# Patient Record
Sex: Female | Born: 1995 | Race: Black or African American | Hispanic: No | Marital: Single | Smoking: Never smoker
Health system: Southern US, Community
[De-identification: ages and names within clinical notes are randomized; demographics above are authoritative.]

## PROBLEM LIST (undated history)

## (undated) DIAGNOSIS — J45909 Unspecified asthma, uncomplicated: Secondary | ICD-10-CM

---
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
## 2016-04-10 ENCOUNTER — Telehealth: Payer: Self-pay

## 2016-04-10 NOTE — Telephone Encounter (Signed)
Patient called and wanted to know if Dr. Okey Duprerawford is taking on new patient.   [phone] call back number for patient.

## 2016-04-10 NOTE — Telephone Encounter (Signed)
Spoke to patient and informed her that we are taking new patients.

## 2016-05-07 ENCOUNTER — Emergency Department (HOSPITAL_COMMUNITY)
Admission: EM | Admit: 2016-05-07 | Discharge: 2016-05-08 | Discharge status: Against Medical Advice | Payer: Self-pay | Attending: Dermatology | Admitting: Dermatology

## 2016-05-07 ENCOUNTER — Encounter (HOSPITAL_COMMUNITY): Payer: Self-pay | Admitting: Emergency Medicine

## 2016-05-07 DIAGNOSIS — R110 Nausea: Secondary | ICD-10-CM | POA: Insufficient documentation

## 2016-05-07 DIAGNOSIS — S0990XA Unspecified injury of head, initial encounter: Secondary | ICD-10-CM | POA: Insufficient documentation

## 2016-05-07 DIAGNOSIS — J45909 Unspecified asthma, uncomplicated: Secondary | ICD-10-CM | POA: Insufficient documentation

## 2016-05-07 DIAGNOSIS — W319XXA Contact with unspecified machinery, initial encounter: Secondary | ICD-10-CM | POA: Insufficient documentation

## 2016-05-07 DIAGNOSIS — Y939 Activity, unspecified: Secondary | ICD-10-CM | POA: Insufficient documentation

## 2016-05-07 DIAGNOSIS — Z5321 Procedure and treatment not carried out due to patient leaving prior to being seen by health care provider: Secondary | ICD-10-CM | POA: Insufficient documentation

## 2016-05-07 DIAGNOSIS — Y990 Civilian activity done for income or pay: Secondary | ICD-10-CM | POA: Insufficient documentation

## 2016-05-07 DIAGNOSIS — Y929 Unspecified place or not applicable: Secondary | ICD-10-CM | POA: Insufficient documentation

## 2016-05-07 HISTORY — DX: Unspecified asthma, uncomplicated: J45.909
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
## 2016-12-15 ENCOUNTER — Encounter (HOSPITAL_COMMUNITY): Payer: Self-pay | Admitting: Emergency Medicine

## 2016-12-15 ENCOUNTER — Emergency Department (HOSPITAL_COMMUNITY): Payer: Worker's Compensation

## 2016-12-15 ENCOUNTER — Emergency Department (HOSPITAL_COMMUNITY)
Admission: EM | Admit: 2016-12-15 | Discharge: 2016-12-15 | Disposition: A | Discharge status: Home / Self Care | Payer: Worker's Compensation | Attending: Emergency Medicine | Admitting: Emergency Medicine

## 2016-12-15 DIAGNOSIS — Y939 Activity, unspecified: Secondary | ICD-10-CM | POA: Insufficient documentation

## 2016-12-15 DIAGNOSIS — Y92019 Unspecified place in single-family (private) house as the place of occurrence of the external cause: Secondary | ICD-10-CM | POA: Diagnosis not present

## 2016-12-15 DIAGNOSIS — Y990 Civilian activity done for income or pay: Secondary | ICD-10-CM | POA: Insufficient documentation

## 2016-12-15 DIAGNOSIS — S0990XA Unspecified injury of head, initial encounter: Secondary | ICD-10-CM | POA: Insufficient documentation

## 2016-12-15 DIAGNOSIS — S60222A Contusion of left hand, initial encounter: Secondary | ICD-10-CM | POA: Insufficient documentation

## 2016-12-15 DIAGNOSIS — S6992XA Unspecified injury of left wrist, hand and finger(s), initial encounter: Secondary | ICD-10-CM | POA: Diagnosis present

## 2016-12-15 DIAGNOSIS — J45909 Unspecified asthma, uncomplicated: Secondary | ICD-10-CM | POA: Insufficient documentation

## 2016-12-15 LAB — POC URINE PREG, ED: Preg Test, Ur: NEGATIVE
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
## 2016-12-15 NOTE — Discharge Instructions (Signed)
You may alternate between Tylenol 1000 mg every 6 hours as needed for pain and ibuprofen 800 mg every 8 hours as needed for pain. ° ° ° °Please avoid alcohol for the next week.  Please rest and drink plenty of water.  We recommend that you avoid any activity that may lead to another head injury for at least 1 week or until your symptoms have completely resolved.  We also recommend "brain rest" - please avoid TV, cell phones, tablets, computers as much as possible for the next 48 hours.   ° ° ° °To find a primary care or specialty doctor please call [phone] or [phone] to access "[facility] Find a Doctor Service." ° °You may also go on the [facility] website at [email or web address] ° °There are also multiple [facility], [facility], [facility] and [facility] practices throughout the [place] that are frequently accepting new patients. You may find a clinic that is close to your home and contact them. ° °[facility] -  °[address] °[place] [address] °[phone] ° ° °[facility] -  °[address] °[place] [address] [phone] ° ° °[facility] - °[address]  °[place] [address] °[phone] ° ° °

## 2018-06-27 ENCOUNTER — Encounter (HOSPITAL_BASED_OUTPATIENT_CLINIC_OR_DEPARTMENT_OTHER): Payer: Self-pay | Admitting: *Deleted

## 2018-06-27 ENCOUNTER — Emergency Department (HOSPITAL_BASED_OUTPATIENT_CLINIC_OR_DEPARTMENT_OTHER): Payer: Self-pay

## 2018-06-27 ENCOUNTER — Emergency Department (HOSPITAL_BASED_OUTPATIENT_CLINIC_OR_DEPARTMENT_OTHER)
Admission: EM | Admit: 2018-06-27 | Discharge: 2018-06-28 | Disposition: A | Discharge status: Home / Self Care | Payer: Self-pay | Attending: Emergency Medicine | Admitting: Emergency Medicine

## 2018-06-27 ENCOUNTER — Other Ambulatory Visit: Payer: Self-pay

## 2018-06-27 DIAGNOSIS — R05 Cough: Secondary | ICD-10-CM | POA: Insufficient documentation

## 2018-06-27 DIAGNOSIS — M940 Chondrocostal junction syndrome [Tietze]: Secondary | ICD-10-CM | POA: Insufficient documentation

## 2018-06-27 DIAGNOSIS — R059 Cough, unspecified: Secondary | ICD-10-CM

## 2018-06-27 NOTE — ED Triage Notes (Signed)
Cough x 2 months. She feels like she has a cold. Pain in her chest.

## 2018-06-28 MED ORDER — NAPROXEN 250 MG PO TABS
500.0000 mg | ORAL_TABLET | Freq: Once | ORAL | Status: AC
  Administered 2018-06-28: 500 mg via ORAL
  Filled 2018-06-28: qty 2

## 2018-06-28 MED ORDER — ALBUTEROL SULFATE HFA 108 (90 BASE) MCG/ACT IN AERS
2.0000 | INHALATION_SPRAY | RESPIRATORY_TRACT | Status: DC | PRN
Start: 2018-06-28 — End: 2018-06-28
  Administered 2018-06-28: 2 via RESPIRATORY_TRACT
  Filled 2018-06-28 (×2): qty 6.7

## 2018-06-28 MED ORDER — NAPROXEN 375 MG PO TABS: ORAL_TABLET | ORAL | 0 refills | Status: DC

## 2018-06-28 NOTE — ED Provider Notes (Signed)
[facility] Provider Note: Martha Dell, MD, FACEP  CSN: [id number] MRN: [id number] ARRIVAL: 06/27/18 at 2214 ROOM: MH02/MH02   CHIEF COMPLAINT  Cough   HISTORY OF PRESENT ILLNESS  06/28/18 12:09 AM Martha Simmons is a 22 y.o. female with a 45-month history of cough.  The cough is primarily nonproductive but occasionally productive of sputum.  She has had no associated fever or chills.  She has an inhaler for asthma but has not used it in a long time and believes it is expired.  She has come in now because her chest is hurting.  The pain is located in the sternum and is worse with cough or movement.  It is moderate in severity.  She denies nausea, vomiting or diarrhea.   Past Medical History:  Diagnosis Date  . Asthma     History reviewed. No pertinent surgical history.  No family history on file.  Social History   Tobacco Use  . Smoking status: Never Smoker  . Smokeless tobacco: Never Used  Substance Use Topics  . Alcohol use: No  . Drug use: No    Prior to Admission medications   Not on File    Allergies Patient has no known allergies.   REVIEW OF SYSTEMS  Negative except as noted here or in the History of Present Illness.   PHYSICAL EXAMINATION  Initial Vital Signs Blood pressure 122/78, pulse 96, temperature 98.1 F (36.7 C), temperature source Oral, resp. rate 18, height 5\' 6"  (1.676 m), weight 56.7 kg (125 lb), last menstrual period 06/06/2018, SpO2 100 %.  Examination General: Well-developed, well-nourished female in no acute distress; appearance consistent with age of record HENT: normocephalic; atraumatic Eyes: pupils equal, round and reactive to light; extraocular muscles intact Neck: supple Heart: regular rate and rhythm Lungs: Mildly decreased air movement bilaterally Chest: Sternal tenderness Abdomen: soft; nondistended; nontender; bowel sounds present Extremities: No deformity; full range of motion; pulses normal Neurologic:  Awake, alert and oriented; motor function intact in all extremities and symmetric; no facial droop Skin: Warm and dry Psychiatric: Normal mood and affect   RESULTS  Summary of this visit's results, reviewed by myself:   EKG Interpretation  Date/Time:    Ventricular Rate:    PR Interval:    QRS Duration:   QT Interval:    QTC Calculation:   R Axis:     Text Interpretation:        Laboratory Studies: No results found for this or any previous visit (from the past 24 hour(s)). Imaging Studies: Dg Chest 2 View  Result Date: 06/27/2018 CLINICAL DATA:  Cough EXAM: CHEST - 2 VIEW COMPARISON:  Report 02/19/2012 FINDINGS: The heart size and mediastinal contours are within normal limits. Both lungs are clear. The visualized skeletal structures are unremarkable. IMPRESSION: No active cardiopulmonary disease. Electronically Signed   By: Jasmine Pang M.D.   On: 06/27/2018 23:18    ED COURSE and MDM  Nursing notes and initial vitals signs, including pulse oximetry, reviewed.  Vitals:   06/27/18 2218 06/27/18 2219 06/27/18 2301  BP:  122/78   Pulse:  85 96  Resp:  16 18  Temp:  98.4 F (36.9 C) 98.1 F (36.7 C)  TempSrc:  Oral Oral  SpO2:  100% 100%  Weight: 56.7 kg (125 lb)    Height: 5\' 6"  (1.676 m)     We will refill patient's inhaler as her cough is likely asthma related.  We will start her on an NSAID for  her chest pain which is likely costochondritis.  PROCEDURES    ED DIAGNOSES     ICD-10-CM   1. Cough R05   2. Acute costochondritis M94.0        Ronnette Rump, Jonny RuizJohn, MD 06/28/18 [phone]

## 2019-09-15 ENCOUNTER — Emergency Department (HOSPITAL_COMMUNITY): Payer: Self-pay

## 2019-09-15 ENCOUNTER — Emergency Department (HOSPITAL_COMMUNITY)
Admission: EM | Admit: 2019-09-15 | Discharge: 2019-09-15 | Disposition: A | Discharge status: Home / Self Care | Payer: Self-pay | Attending: Emergency Medicine | Admitting: Emergency Medicine

## 2019-09-15 ENCOUNTER — Other Ambulatory Visit: Payer: Self-pay

## 2019-09-15 ENCOUNTER — Encounter (HOSPITAL_COMMUNITY): Payer: Self-pay

## 2019-09-15 DIAGNOSIS — J45909 Unspecified asthma, uncomplicated: Secondary | ICD-10-CM | POA: Insufficient documentation

## 2019-09-15 DIAGNOSIS — R1010 Upper abdominal pain, unspecified: Secondary | ICD-10-CM | POA: Insufficient documentation

## 2019-09-15 DIAGNOSIS — Z79899 Other long term (current) drug therapy: Secondary | ICD-10-CM | POA: Insufficient documentation

## 2019-09-15 LAB — COMPREHENSIVE METABOLIC PANEL
ALT: 12 U/L (ref 0–44)
AST: 20 U/L (ref 15–41)
Albumin: 3.6 g/dL (ref 3.5–5.0)
Alkaline Phosphatase: 45 U/L (ref 38–126)
Anion gap: 10 (ref 5–15)
BUN: 14 mg/dL (ref 6–20)
CO2: 24 mmol/L (ref 22–32)
Calcium: 8.9 mg/dL (ref 8.9–10.3)
Chloride: 106 mmol/L (ref 98–111)
Creatinine, Ser: 0.87 mg/dL (ref 0.44–1.00)
GFR calc Af Amer: >60 mL/min (ref >60)
GFR calc non Af Amer: >60 mL/min (ref >60)
Glucose, Bld: 99 mg/dL (ref 70–99)
Potassium: 3.9 mmol/L (ref 3.5–5.1)
Sodium: 140 mmol/L (ref 135–145)
Total Bilirubin: 0.7 mg/dL (ref 0.3–1.2)
Total Protein: 7.2 g/dL (ref 6.5–8.1)

## 2019-09-15 LAB — CBC
HCT: 37.5 % (ref 36.0–46.0)
Hemoglobin: 11.4 g/dL — ABNORMAL LOW (ref 12.0–15.0)
MCH: 27.5 pg (ref 26.0–34.0)
MCHC: 30.4 g/dL (ref 30.0–36.0)
MCV: 90.6 fL (ref 80.0–100.0)
Platelets: 269 10*3/uL (ref 150–400)
RBC: 4.14 MIL/uL (ref 3.87–5.11)
RDW: 11.9 % (ref 11.5–15.5)
WBC: 7.7 10*3/uL (ref 4.0–10.5)
nRBC: 0 % (ref 0.0–0.2)

## 2019-09-15 LAB — I-STAT BETA HCG BLOOD, ED (MC, WL, AP ONLY): I-stat hCG, quantitative: <5 m[IU]/mL (ref <5)

## 2019-09-15 LAB — LIPASE, BLOOD: Lipase: 44 U/L (ref 11–51)

## 2019-09-15 MED ORDER — IOHEXOL 300 MG/ML  SOLN
75.0000 mL | Freq: Once | INTRAMUSCULAR | Status: AC | PRN
  Administered 2019-09-15: 75 mL via INTRAVENOUS

## 2019-09-15 MED ORDER — SODIUM CHLORIDE (PF) 0.9 % IJ SOLN
INTRAMUSCULAR | Status: AC
  Filled 2019-09-15: qty 50

## 2019-09-15 MED ORDER — HYDROMORPHONE HCL 1 MG/ML IJ SOLN
0.5000 mg | Freq: Once | INTRAMUSCULAR | Status: AC
  Administered 2019-09-15: 0.5 mg via INTRAVENOUS
  Filled 2019-09-15: qty 1

## 2019-09-15 MED ORDER — ONDANSETRON HCL 4 MG/2ML IJ SOLN
4.0000 mg | Freq: Once | INTRAMUSCULAR | Status: AC
  Administered 2019-09-15: 4 mg via INTRAVENOUS
  Filled 2019-09-15: qty 2

## 2019-09-15 MED ORDER — SODIUM CHLORIDE 0.9% FLUSH
3.0000 mL | Freq: Once | INTRAVENOUS | Status: AC
  Administered 2019-09-15: 3 mL via INTRAVENOUS

## 2019-09-15 NOTE — Discharge Instructions (Addendum)
Take liquid motrin for pain.   You can get that at the pharmacy without a prescription.  Follow-up with the women's clinic either this week or next week

## 2019-09-15 NOTE — ED Provider Notes (Signed)
[facility] DEPT Provider Note   CSN: [id number] Arrival date & time: 09/15/19  1443     History   Chief Complaint Chief Complaint  Patient presents with   Abdominal Pain    HPI Martha Simmons is a 23 y.o. female.     Patient complains of right lower quadrant pain.  Patient has no fever no chills  The history is provided by the patient. No language interpreter was used.  Abdominal Pain Pain location:  RLQ Pain quality: aching   Pain radiates to:  Does not radiate Pain severity:  Moderate Onset quality:  Sudden Timing:  Constant Progression:  Worsening Chronicity:  New Context: not alcohol use   Relieved by:  Nothing Worsened by:  Nothing Associated symptoms: no chest pain, no cough, no diarrhea, no fatigue and no hematuria     Past Medical History:  Diagnosis Date   Asthma     There are no active problems to display for this patient.   History reviewed. No pertinent surgical history.   OB History   No obstetric history on file.      Home Medications    Prior to Admission medications   Medication Sig Start Date End Date Taking? Authorizing Provider  albuterol (VENTOLIN HFA) 108 (90 Base) MCG/ACT inhaler Inhale 2 puffs into the lungs every 4 (four) hours as needed for shortness of breath. 02/16/13  Yes [provider]  naproxen (NAPROSYN) 375 MG tablet Take 1 tablet twice daily as needed for chest wall pain. Patient not taking: Reported on 09/15/2019 06/28/18   Molpus, Jenny Reichmann, MD    Family History History reviewed. No pertinent family history.  Social History Social History   Tobacco Use   Smoking status: Never Smoker   Smokeless tobacco: Never Used  Substance Use Topics   Alcohol use: No   Drug use: No     Allergies   Patient has no known allergies.   Review of Systems Review of Systems  Constitutional: Negative for appetite change and fatigue.  HENT: Negative for congestion, ear discharge  and sinus pressure.   Eyes: Negative for discharge.  Respiratory: Negative for cough.   Cardiovascular: Negative for chest pain.  Gastrointestinal: Positive for abdominal pain. Negative for diarrhea.  Genitourinary: Negative for frequency and hematuria.  Musculoskeletal: Negative for back pain.  Skin: Negative for rash.  Neurological: Negative for seizures and headaches.  Psychiatric/Behavioral: Negative for hallucinations.     Physical Exam Updated Vital Signs Pulse 87    Ht 5\' 6"  (1.676 m)    Wt 56.7 kg    LMP 09/14/2019 (Approximate)    SpO2 100%    BMI 20.18 kg/m   Physical Exam Vitals signs and nursing note reviewed.  Constitutional:      Appearance: She is well-developed.  HENT:     Head: Normocephalic.     Mouth/Throat:     Mouth: Mucous membranes are moist.  Eyes:     General: No scleral icterus.    Conjunctiva/sclera: Conjunctivae normal.  Neck:     Musculoskeletal: Neck supple.     Thyroid: No thyromegaly.  Cardiovascular:     Rate and Rhythm: Normal rate and regular rhythm.     Heart sounds: No murmur. No friction rub. No gallop.   Pulmonary:     Breath sounds: No stridor. No wheezing or rales.  Chest:     Chest wall: No tenderness.  Abdominal:     General: There is no distension.  Tenderness: There is abdominal tenderness. There is no rebound.  Musculoskeletal: Normal range of motion.  Lymphadenopathy:     Cervical: No cervical adenopathy.  Skin:    Findings: No erythema or rash.  Neurological:     Mental Status: She is alert and oriented to person, place, and time.     Motor: No abnormal muscle tone.     Coordination: Coordination normal.  Psychiatric:        Behavior: Behavior normal.      ED Treatments / Results  Labs (all labs ordered are listed, but only abnormal results are displayed) Labs Reviewed  CBC - Abnormal; Notable for the following components:      Result Value   Hemoglobin 11.4 (*)    All other components within normal  limits  LIPASE, BLOOD  COMPREHENSIVE METABOLIC PANEL  URINALYSIS, ROUTINE W REFLEX MICROSCOPIC  I-STAT BETA HCG BLOOD, ED ([facility], [facility], [facility] ONLY)    EKG None  Radiology Ct Abdomen Pelvis W Contrast  Result Date: 09/15/2019 CLINICAL DATA:  23 year old female with abdominal distension and pain. One episode vomiting. EXAM: CT ABDOMEN AND PELVIS WITH CONTRAST TECHNIQUE: Multidetector CT imaging of the abdomen and pelvis was performed using the standard protocol following bolus administration of intravenous contrast. CONTRAST:  15mL OMNIPAQUE IOHEXOL 300 MG/ML  SOLN COMPARISON:  None. FINDINGS: Lower chest: The visualized lung bases are clear. No intra-abdominal free air. Small free fluid within the pelvis. Hepatobiliary: No focal liver abnormality is seen. No gallstones, gallbladder wall thickening, or biliary dilatation. Pancreas: Unremarkable. No pancreatic ductal dilatation or surrounding inflammatory changes. Spleen: Normal in size without focal abnormality. Adrenals/Urinary Tract: The adrenal glands are unremarkable. Polycystic renal morphology. The largest cyst measures up to 17 mm in the interpolar aspect the left kidney. Ultrasound or MRI may provide better evaluation of the cysts on a nonemergent basis. There is no hydronephrosis on either side. The visualized ureters and urinary bladder appear unremarkable. Stomach/Bowel: Apparent mild thickened appearance of the gastroesophageal junction may be related to underdistention. Focal esophagitis is not excluded. Clinical correlation is recommended. There is no bowel obstruction. The appendix is normal. Vascular/Lymphatic: The abdominal aorta and IVC are unremarkable. No venous gas. There is no adenopathy. Reproductive: The uterus and ovaries are grossly unremarkable. No mass. Other: None Musculoskeletal: No acute or significant osseous findings. IMPRESSION: 1. Apparent mild thickened appearance of the gastroesophageal junction may be related to  underdistention. Focal esophagitis is not excluded. Clinical correlation is recommended. No bowel obstruction. Normal appendix. 2. Polycystic renal morphology. Ultrasound or MRI may provide better evaluation of the cysts on a nonemergent basis. Electronically Signed   By: Elgie Collard M.D.   On: 09/15/2019 20:20    Procedures Procedures (including critical care time)  Medications Ordered in ED Medications  sodium chloride (PF) 0.9 % injection (0 mLs  Hold 09/15/19 1932)  sodium chloride flush (NS) 0.9 % injection 3 mL (3 mLs Intravenous Given 09/15/19 1819)  HYDROmorphone (DILAUDID) injection 0.5 mg (0.5 mg Intravenous Given 09/15/19 1818)  ondansetron (ZOFRAN) injection 4 mg (4 mg Intravenous Given 09/15/19 1820)  iohexol (OMNIPAQUE) 300 MG/ML solution 75 mL (75 mLs Intravenous Contrast Given 09/15/19 1920)     Initial Impression / Assessment and Plan / ED Course  I have reviewed the triage vital signs and the nursing notes.  Pertinent labs & imaging results that were available during my care of the patient were reviewed by me and considered in my medical decision making (see chart for details).  Labs unremarkable.  CT scan suggests cysts on her right ovary.  She will be given Motrin for pain and will follow-up with GYN clinic  Final Clinical Impressions(s) / ED Diagnoses   Final diagnoses:  Pain of upper abdomen    ED Discharge Orders    None       Bethann BerkshireZammit, Eeshan Verbrugge, MD 09/15/19 2115

## 2019-09-15 NOTE — ED Triage Notes (Signed)
Pt presents with c/o abdominal pain that started 45 minutes ago. Pt reports one episode of vomiting since the pain started. Pt is very tearful in triage.

## 2019-09-15 NOTE — ED Notes (Signed)
\  [id number]\

## 2020-03-25 ENCOUNTER — Other Ambulatory Visit: Payer: Self-pay

## 2020-03-25 ENCOUNTER — Emergency Department (HOSPITAL_COMMUNITY)
Admission: EM | Admit: 2020-03-25 | Discharge: 2020-03-25 | Disposition: A | Discharge status: Home / Self Care | Payer: Self-pay | Attending: Emergency Medicine | Admitting: Emergency Medicine

## 2020-03-25 DIAGNOSIS — J45909 Unspecified asthma, uncomplicated: Secondary | ICD-10-CM | POA: Insufficient documentation

## 2020-03-25 DIAGNOSIS — J020 Streptococcal pharyngitis: Secondary | ICD-10-CM | POA: Insufficient documentation

## 2020-03-25 LAB — GROUP A STREP BY PCR: Group A Strep by PCR: DETECTED — AB

## 2020-03-25 MED ORDER — AMOXICILLIN 400 MG/5ML PO SUSR: 1000.0000 mg | Freq: Every day | ORAL | 0 refills | Status: AC

## 2020-03-25 MED ORDER — DEXAMETHASONE 1 MG/ML PO CONC
10.0000 mg | Freq: Once | ORAL | Status: AC
Start: 2020-03-25 — End: 2020-03-25
  Administered 2020-03-25: 17:00:00 10 mg via ORAL
  Filled 2020-03-25: qty 10

## 2020-03-25 MED ORDER — ACETAMINOPHEN 160 MG/5ML PO SOLN
650.0000 mg | Freq: Once | ORAL | Status: AC
  Administered 2020-03-25: 650 mg via ORAL
  Filled 2020-03-25: qty 20.3

## 2020-03-25 NOTE — ED Triage Notes (Signed)
C/O difficulty swallowing and feels like throat is closing. VSS

## 2020-03-25 NOTE — ED Provider Notes (Signed)
[facility] EMERGENCY DEPARTMENT Provider Note   CSN: [id number] Arrival date & time: 03/25/20  1355     History Chief Complaint  Patient presents with  . Sore Throat    Martha Simmons is a 24 y.o. female.  HPI   24 year old female history of asthma presenting for evaluation of sore throat that started yesterday.  States the pain is constant and severe in nature.  It is worse when she tries to swallow.  She has been able to drink some water at home.  Denies any associated fevers.  Denies other symptoms.  Denies any contacts with similar symptoms.  Past Medical History:  Diagnosis Date  . Asthma     There are no problems to display for this patient.   No past surgical history on file.   OB History   No obstetric history on file.     No family history on file.  Social History   Tobacco Use  . Smoking status: Never Smoker  . Smokeless tobacco: Never Used  Substance Use Topics  . Alcohol use: No  . Drug use: No    Home Medications Prior to Admission medications   Medication Sig Start Date End Date Taking? Authorizing Provider  albuterol (VENTOLIN HFA) 108 (90 Base) MCG/ACT inhaler Inhale 2 puffs into the lungs every 4 (four) hours as needed for shortness of breath. 02/16/13   [provider]  amoxicillin (AMOXIL) 400 MG/5ML suspension Take 12.5 mLs (1,000 mg total) by mouth daily for 10 days. 03/25/20 04/04/20  Aldo Sondgeroth S, PA-C  naproxen (NAPROSYN) 375 MG tablet Take 1 tablet twice daily as needed for chest wall pain. Patient not taking: Reported on 09/15/2019 06/28/18   Molpus, Jenny Reichmann, MD    Allergies    Patient has no known allergies.  Review of Systems   Review of Systems  Constitutional: Negative for fever.  HENT: Positive for sore throat. Negative for congestion and rhinorrhea.   Respiratory: Negative for cough and shortness of breath.   Cardiovascular: Negative for chest pain.  Gastrointestinal: Negative for abdominal  pain.    Physical Exam Updated Vital Signs BP 108/65   Pulse 84   Temp 99.1 F (37.3 C) (Oral)   Resp 18   Ht 5\' 6"  (1.676 m)   Wt 56.7 kg   SpO2 99%   BMI 20.18 kg/m   Physical Exam Vitals and nursing note reviewed.  Constitutional:      General: She is not in acute distress.    Appearance: She is well-developed.  HENT:     Head: Normocephalic and atraumatic.     Mouth/Throat:     Mouth: Mucous membranes are moist.     Pharynx: Posterior oropharyngeal erythema present. No oropharyngeal exudate or uvula swelling.     Tonsils: No tonsillar exudate or tonsillar abscesses. 1+ on the right. 1+ on the left.  Eyes:     Conjunctiva/sclera: Conjunctivae normal.  Cardiovascular:     Rate and Rhythm: Normal rate.  Pulmonary:     Effort: Pulmonary effort is normal.  Musculoskeletal:        General: Normal range of motion.     Cervical back: Neck supple.  Lymphadenopathy:     Cervical: Cervical adenopathy present.  Skin:    General: Skin is warm and dry.  Neurological:     Mental Status: She is alert.     ED Results / Procedures / Treatments   Labs (all labs ordered are listed, but only abnormal results  are displayed) Labs Reviewed  GROUP A STREP BY PCR - Abnormal; Notable for the following components:      Result Value   Group A Strep by PCR DETECTED (*)    All other components within normal limits    EKG None  Radiology No results found.  Procedures Procedures (including critical care time)  Medications Ordered in ED Medications  dexamethasone (DECADRON) 1 MG/ML solution 10 mg (has no administration in time range)  acetaminophen (TYLENOL) 160 MG/5ML solution 650 mg (650 mg Oral Given 03/25/20 1413)    ED Course  I have reviewed the triage vital signs and the nursing notes.  Pertinent labs & imaging results that were available during my care of the patient were reviewed by me and considered in my medical decision making (see chart for details).    MDM  Rules/Calculators/A&P                      Pt febrile with tonsillar exudate, cervical lymphadenopathy, & dysphagia; diagnosis of strep. Treated in the Ed with steroids. Pt declines IM PCN. Will give oral abx. Presentation non concerning for PTA or infxn spread to soft tissue. No trismus or uvula deviation. Specific return precautions discussed. Pt able to drink water at home with intact air way. Recommended PCP follow up.    Final Clinical Impression(s) / ED Diagnoses Final diagnoses:  Strep throat    Rx / DC Orders ED Discharge Orders         Ordered    amoxicillin (AMOXIL) 400 MG/5ML suspension  Daily     03/25/20 1632           Karrie Meres, PA-C 03/25/20 1632    Tegeler, Canary Brim, MD 03/28/20 [phone]

## 2020-03-25 NOTE — ED Notes (Signed)
Patient verbalizes understanding of discharge instructions. Opportunity for questioning and answers were provided. Armband removed by staff, pt discharged from ED ambulatory.   

## 2020-03-25 NOTE — Discharge Instructions (Addendum)
You were given a prescription for antibiotics. Please take the antibiotic prescription fully.   Please follow up with your primary care provider within 5-7 days for re-evaluation of your symptoms. If you do not have a primary care provider, information for a healthcare clinic has been provided for you to make arrangements for follow up care. Please return to the emergency department for any new or worsening symptoms.  

## 2020-07-31 ENCOUNTER — Emergency Department (HOSPITAL_COMMUNITY)
Admission: EM | Admit: 2020-07-31 | Discharge: 2020-07-31 | Disposition: A | Discharge status: Home / Self Care | Payer: Self-pay | Attending: Emergency Medicine | Admitting: Emergency Medicine

## 2020-07-31 DIAGNOSIS — Z5321 Procedure and treatment not carried out due to patient leaving prior to being seen by health care provider: Secondary | ICD-10-CM | POA: Insufficient documentation

## 2020-07-31 DIAGNOSIS — J029 Acute pharyngitis, unspecified: Secondary | ICD-10-CM | POA: Insufficient documentation

## 2020-07-31 NOTE — ED Notes (Signed)
Pt called for triage, no response. 

## 2020-07-31 NOTE — ED Triage Notes (Signed)
Pt states that she woke up with sore throat.  Pt is not covid vaccinated

## 2020-07-31 NOTE — ED Notes (Signed)
Called pt for triage multiple times.  No answer.

## 2020-07-31 NOTE — ED Notes (Signed)
Pt did not respond/come forward when called for triage.

## 2020-08-01 ENCOUNTER — Ambulatory Visit
Admission: EM | Admit: 2020-08-01 | Discharge: 2020-08-01 | Disposition: A | Discharge status: Home / Self Care | Payer: Self-pay | Attending: Emergency Medicine | Admitting: Emergency Medicine

## 2020-08-01 ENCOUNTER — Other Ambulatory Visit: Payer: Self-pay

## 2020-08-01 DIAGNOSIS — J069 Acute upper respiratory infection, unspecified: Secondary | ICD-10-CM

## 2020-08-01 DIAGNOSIS — Z1152 Encounter for screening for COVID-19: Secondary | ICD-10-CM

## 2020-08-01 MED ORDER — PSEUDOEPH-BROMPHEN-DM 30-2-10 MG/5ML PO SYRP: 10.0000 mL | ORAL_SOLUTION | Freq: Four times a day (QID) | ORAL | 0 refills | Status: DC | PRN

## 2020-08-01 MED ORDER — IBUPROFEN 100 MG/5ML PO SUSP: 600.0000 mg | Freq: Four times a day (QID) | ORAL | 0 refills | Status: DC | PRN

## 2020-08-01 NOTE — ED Triage Notes (Signed)
Pt c/o cough and nasal congestion since yesterday. States took theriflu last night. Requesting covid testing. Denies having covid vaccines.

## 2020-08-01 NOTE — ED Provider Notes (Signed)
[name]    CSN: [id number] Arrival date & time: 08/01/20  7902      History   Chief Complaint Chief Complaint  Patient presents with   Cough    HPI Martha Simmons is a 24 y.o. female history of asthma presenting today for evaluation of cough sore throat and congestion.  Symptoms began yesterday.  Took TheraFlu last night without significant relief.  Reports being unvaccinated.  Denies GI symptoms.  Denies chest pain or shortness of breath.  HPI  Past Medical History:  Diagnosis Date   Asthma     There are no problems to display for this patient.   History reviewed. No pertinent surgical history.  OB History   No obstetric history on file.      Home Medications    Prior to Admission medications   Medication Sig Start Date End Date Taking? Authorizing Provider  albuterol (VENTOLIN HFA) 108 (90 Base) MCG/ACT inhaler Inhale 2 puffs into the lungs every 4 (four) hours as needed for shortness of breath. 02/16/13   [provider]  brompheniramine-pseudoephedrine-DM 30-2-10 MG/5ML syrup Take 10 mLs by mouth 4 (four) times daily as needed. 08/01/20   Ziare Cryder C, PA-C  ibuprofen (ADVIL) 100 MG/5ML suspension Take 30 mLs (600 mg total) by mouth every 6 (six) hours as needed. 08/01/20   Demetria Iwai, Junius Creamer, PA-C    Family History No family history on file.  Social History Social History   Tobacco Use   Smoking status: Never Smoker   Smokeless tobacco: Never Used  Substance Use Topics   Alcohol use: Yes   Drug use: No     Allergies   Patient has no known allergies.   Review of Systems Review of Systems  Constitutional: Negative for activity change, appetite change, chills, fatigue and fever.  HENT: Positive for congestion, rhinorrhea and sore throat. Negative for ear pain, sinus pressure and trouble swallowing.   Eyes: Negative for discharge and redness.  Respiratory: Positive for cough. Negative for chest tightness and  shortness of breath.   Cardiovascular: Negative for chest pain.  Gastrointestinal: Negative for abdominal pain, diarrhea, nausea and vomiting.  Musculoskeletal: Negative for myalgias.  Skin: Negative for rash.  Neurological: Negative for dizziness, light-headedness and headaches.     Physical Exam Triage Vital Signs ED Triage Vitals [08/01/20 1108]  Enc Vitals Group     BP 112/77     Pulse Rate 81     Resp 18     Temp 98.4 F (36.9 C)     Temp Source Oral     SpO2 97 %     Weight      Height      Head Circumference      Peak Flow      Pain Score 0     Pain Loc      Pain Edu?      Excl. in GC?    No data found.  Updated Vital Signs BP 112/77 (BP Location: Left Arm)    Pulse 81    Temp 98.4 F (36.9 C) (Oral)    Resp 18    LMP 07/29/2020    SpO2 97%   Visual Acuity Right Eye Distance:   Left Eye Distance:   Bilateral Distance:    Right Eye Near:   Left Eye Near:    Bilateral Near:     Physical Exam Vitals and nursing note reviewed.  Constitutional:      Appearance: She is well-developed.  Comments: No acute distress  HENT:     Head: Normocephalic and atraumatic.     Ears:     Comments: Bilateral ears without tenderness to palpation of external auricle, tragus and mastoid, EAC's without erythema or swelling, TM's with good bony landmarks and cone of light. Non erythematous.     Nose: Nose normal.     Mouth/Throat:     Comments: Oral mucosa pink and moist, no tonsillar enlargement or exudate. Posterior pharynx patent and nonerythematous, no uvula deviation or swelling. Normal phonation. Eyes:     Conjunctiva/sclera: Conjunctivae normal.  Cardiovascular:     Rate and Rhythm: Normal rate.  Pulmonary:     Effort: Pulmonary effort is normal. No respiratory distress.     Comments: Breathing comfortably at rest, CTABL, no wheezing, rales or other adventitious sounds auscultated Abdominal:     General: There is no distension.  Musculoskeletal:         General: Normal range of motion.     Cervical back: Neck supple.  Skin:    General: Skin is warm and dry.  Neurological:     Mental Status: She is alert and oriented to person, place, and time.      UC Treatments / Results  Labs (all labs ordered are listed, but only abnormal results are displayed) Labs Reviewed  NOVEL CORONAVIRUS, NAA    EKG   Radiology No results found.  Procedures Procedures (including critical care time)  Medications Ordered in UC Medications - No data to display  Initial Impression / Assessment and Plan / UC Course  I have reviewed the triage vital signs and the nursing notes.  Pertinent labs & imaging results that were available during my care of the patient were reviewed by me and considered in my medical decision making (see chart for details).     1-2 days of URI symptoms, vital signs stable, exam unremarkable, recommending symptomatic and supportive care.  Rest and fluids.  Suspect likely viral etiology, Covid test pending.  Discussed strict return precautions. Patient verbalized understanding and is agreeable with plan.  Final Clinical Impressions(s) / UC Diagnoses   Final diagnoses:  Encounter for screening for COVID-19  Viral URI with cough     Discharge Instructions     Covid test pending, monitor my chart for results Ibuprofen and Tylenol for sore throat, body aches, headache Cough syrup as needed for cough/congestion Rest and fluids Follow-up if any symptoms not improving or worsening    ED Prescriptions    Medication Sig Dispense Auth. Provider   ibuprofen (ADVIL) 100 MG/5ML suspension Take 30 mLs (600 mg total) by mouth every 6 (six) hours as needed. 473 mL Lakeisa Heninger C, PA-C   brompheniramine-pseudoephedrine-DM 30-2-10 MG/5ML syrup Take 10 mLs by mouth 4 (four) times daily as needed. 120 mL Sean Macwilliams, [place] C, PA-C     PDMP not reviewed this encounter.   Lew Dawes, [place] 08/01/20 1247

## 2020-08-01 NOTE — Discharge Instructions (Signed)
Covid test pending, monitor my chart for results Ibuprofen and Tylenol for sore throat, body aches, headache Cough syrup as needed for cough/congestion Rest and fluids Follow-up if any symptoms not improving or worsening

## 2020-08-02 LAB — NOVEL CORONAVIRUS, NAA: SARS-CoV-2, NAA: NOT DETECTED

## 2020-09-22 ENCOUNTER — Other Ambulatory Visit: Payer: Self-pay

## 2020-09-22 ENCOUNTER — Ambulatory Visit
Admission: EM | Admit: 2020-09-22 | Discharge: 2020-09-22 | Disposition: A | Discharge status: Home / Self Care | Payer: Self-pay | Attending: Physician Assistant | Admitting: Physician Assistant

## 2020-09-22 DIAGNOSIS — J4521 Mild intermittent asthma with (acute) exacerbation: Secondary | ICD-10-CM

## 2020-09-22 MED ORDER — ALBUTEROL SULFATE HFA 108 (90 BASE) MCG/ACT IN AERS
2.0000 | INHALATION_SPRAY | Freq: Once | RESPIRATORY_TRACT | Status: AC
  Administered 2020-09-22: 2 via RESPIRATORY_TRACT

## 2020-09-22 NOTE — ED Provider Notes (Signed)
[name]    CSN: [id number] Arrival date & time: 09/22/20  0086      History   Chief Complaint Chief Complaint  Patient presents with  . Asthma    HPI Martha Simmons is a 24 y.o. female.   24 year old female comes in for chest tightness and shortness of breath when she woke up this morning.  Denies other URI symptoms such as cough, congestion, sore throat.  Denies fever, chills, body aches.  Used her inhaler without relief.     Past Medical History:  Diagnosis Date  . Asthma     There are no problems to display for this patient.   History reviewed. No pertinent surgical history.  OB History   No obstetric history on file.      Home Medications    Prior to Admission medications   Medication Sig Start Date End Date Taking? Authorizing Provider  albuterol (VENTOLIN HFA) 108 (90 Base) MCG/ACT inhaler Inhale 2 puffs into the lungs every 4 (four) hours as needed for shortness of breath. 02/16/13   [provider]    Family History History reviewed. No pertinent family history.  Social History Social History   Tobacco Use  . Smoking status: Never Smoker  . Smokeless tobacco: Never Used  Substance Use Topics  . Alcohol use: Yes  . Drug use: No     Allergies   Patient has no known allergies.   Review of Systems Review of Systems  Reason unable to perform ROS: See HPI as above.     Physical Exam Triage Vital Signs ED Triage Vitals  Enc Vitals Group     BP 09/22/20 0831 115/80     Pulse Rate 09/22/20 0831 90     Resp 09/22/20 0831 16     Temp 09/22/20 0831 98.7 F (37.1 C)     Temp Source 09/22/20 0831 Oral     SpO2 09/22/20 0831 98 %     Weight --      Height --      Head Circumference --      Peak Flow --      Pain Score 09/22/20 0832 0     Pain Loc --      Pain Edu? --      Excl. in GC? --    No data found.  Updated Vital Signs BP 115/80 (BP Location: Left Arm)   Pulse 90   Temp 98.7 F (37.1 C) (Oral)    Resp 16   LMP 09/22/2020   SpO2 98%   Physical Exam Constitutional:      General: She is not in acute distress.    Appearance: Normal appearance. She is well-developed. She is not toxic-appearing or diaphoretic.  HENT:     Head: Normocephalic and atraumatic.  Eyes:     Conjunctiva/sclera: Conjunctivae normal.     Pupils: Pupils are equal, round, and reactive to light.  Cardiovascular:     Rate and Rhythm: Normal rate and regular rhythm.  Pulmonary:     Effort: Pulmonary effort is normal. No respiratory distress.     Comments: Decreased air movement. LCTAB  Albuterol 2 puffs: LCTAB, good air movement Musculoskeletal:     Cervical back: Normal range of motion and neck supple.  Skin:    General: Skin is warm and dry.  Neurological:     Mental Status: She is alert and oriented to person, place, and time.      UC Treatments / Results  Labs (  all labs ordered are listed, but only abnormal results are displayed) Labs Reviewed - No data to display  EKG   Radiology No results found.  Procedures Procedures (including critical care time)  Medications Ordered in UC Medications  albuterol (VENTOLIN HFA) 108 (90 Base) MCG/ACT inhaler 2 puff (has no administration in time range)    Initial Impression / Assessment and Plan / UC Course  I have reviewed the triage vital signs and the nursing notes.  Pertinent labs & imaging results that were available during my care of the patient were reviewed by me and considered in my medical decision making (see chart for details).    Upon examining patient's albuterol inhaler, it expired 1 year ago.  New inhaler provided, patient with relief after doing 2 puffs, lungs clear to auscultation bilaterally with adventitious lung sounds.  Will have patient continue albuterol at this time.  Discussed if develop other URI symptoms, fever, will need Covid testing.  Return precautions given.  Final Clinical Impressions(s) / UC Diagnoses   Final  diagnoses:  Mild intermittent asthma with acute exacerbation    ED Prescriptions    None     PDMP not reviewed this encounter.   Belinda Fisher, PA-C 09/22/20 [phone]

## 2020-09-22 NOTE — Discharge Instructions (Signed)
Albuterol 1-2 puffs every 4-6hrs as needed. Do 2 puffs before bedtime. If starting to have cold symptoms such as cough, nasal drainage, fever, will need COVID testing. If albuterol no longer helps, follow up for reevaluation.

## 2020-09-22 NOTE — ED Triage Notes (Signed)
Pt c/o chest tightness and SOB when she woke up this am. States used her inhaler with no relief. Pt speaking in complete sentences. No distress noted.

## 2020-09-24 ENCOUNTER — Other Ambulatory Visit: Payer: Self-pay

## 2020-09-24 ENCOUNTER — Ambulatory Visit
Admission: EM | Admit: 2020-09-24 | Discharge: 2020-09-24 | Disposition: A | Discharge status: Home / Self Care | Payer: No Typology Code available for payment source | Attending: Emergency Medicine | Admitting: Emergency Medicine

## 2020-09-24 ENCOUNTER — Ambulatory Visit (INDEPENDENT_AMBULATORY_CARE_PROVIDER_SITE_OTHER): Payer: No Typology Code available for payment source

## 2020-09-24 ENCOUNTER — Encounter: Payer: Self-pay | Admitting: Emergency Medicine

## 2020-09-24 DIAGNOSIS — M545 Low back pain, unspecified: Secondary | ICD-10-CM

## 2020-09-24 MED ORDER — NAPROXEN 500 MG PO TABS: 500.0000 mg | ORAL_TABLET | Freq: Two times a day (BID) | ORAL | 0 refills | Status: AC

## 2020-09-24 MED ORDER — CYCLOBENZAPRINE HCL 5 MG PO TABS: 5.0000 mg | ORAL_TABLET | Freq: Two times a day (BID) | ORAL | 0 refills | Status: AC | PRN
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
## 2020-09-24 NOTE — Discharge Instructions (Signed)

## 2020-09-26 ENCOUNTER — Other Ambulatory Visit: Payer: Self-pay

## 2020-09-26 ENCOUNTER — Encounter (HOSPITAL_COMMUNITY): Payer: Self-pay | Admitting: *Deleted

## 2020-09-26 ENCOUNTER — Emergency Department (HOSPITAL_COMMUNITY)
Admission: EM | Admit: 2020-09-26 | Discharge: 2020-09-27 | Disposition: A | Discharge status: Home / Self Care | Payer: Self-pay | Attending: Emergency Medicine | Admitting: Emergency Medicine

## 2020-09-26 DIAGNOSIS — Z5321 Procedure and treatment not carried out due to patient leaving prior to being seen by health care provider: Secondary | ICD-10-CM | POA: Insufficient documentation

## 2020-09-26 DIAGNOSIS — R519 Headache, unspecified: Secondary | ICD-10-CM | POA: Insufficient documentation

## 2020-09-26 DIAGNOSIS — R110 Nausea: Secondary | ICD-10-CM | POA: Insufficient documentation

## 2020-09-26 DIAGNOSIS — M549 Dorsalgia, unspecified: Secondary | ICD-10-CM | POA: Insufficient documentation
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
## 2020-09-27 NOTE — ED Notes (Signed)
Pt did not answer for several calls of vital recheck

## 2020-12-21 ENCOUNTER — Ambulatory Visit (HOSPITAL_COMMUNITY)
Admission: EM | Admit: 2020-12-21 | Discharge: 2020-12-21 | Disposition: A | Discharge status: Home / Self Care | Payer: BC Managed Care – PPO | Attending: Emergency Medicine | Admitting: Emergency Medicine

## 2020-12-21 ENCOUNTER — Encounter (HOSPITAL_COMMUNITY): Payer: Self-pay

## 2020-12-21 ENCOUNTER — Other Ambulatory Visit: Payer: Self-pay

## 2020-12-21 DIAGNOSIS — R197 Diarrhea, unspecified: Secondary | ICD-10-CM | POA: Insufficient documentation

## 2020-12-21 DIAGNOSIS — Z20822 Contact with and (suspected) exposure to covid-19: Secondary | ICD-10-CM | POA: Insufficient documentation

## 2020-12-21 DIAGNOSIS — R112 Nausea with vomiting, unspecified: Secondary | ICD-10-CM | POA: Diagnosis not present

## 2020-12-21 MED ORDER — ONDANSETRON 4 MG PO TBDP: 4.0000 mg | ORAL_TABLET | Freq: Three times a day (TID) | ORAL | 0 refills | Status: AC | PRN

## 2020-12-21 MED ORDER — DICYCLOMINE HCL 20 MG PO TABS: 20.0000 mg | ORAL_TABLET | Freq: Two times a day (BID) | ORAL | 0 refills | Status: AC | PRN

## 2020-12-21 MED ORDER — ONDANSETRON 4 MG PO TBDP
ORAL_TABLET | ORAL | Status: AC
  Filled 2020-12-21: qty 1

## 2020-12-21 MED ORDER — ONDANSETRON 4 MG PO TBDP
4.0000 mg | ORAL_TABLET | Freq: Once | ORAL | Status: AC
Start: 2020-12-21 — End: 2020-12-21
  Administered 2020-12-21: 4 mg via ORAL

## 2020-12-21 NOTE — ED Provider Notes (Signed)
[facility]    CSN: [id number] Arrival date & time: 12/21/20  1909      History   Chief Complaint Chief Complaint  Patient presents with  . Emesis  . Abdominal Pain  . Diarrhea    HPI Martha Simmons is a 25 y.o. female.   Justise Ehmann presents with complaints of nausea, vomiting and diarrhea. Symptoms started with nausea and diarrhea which started 1/14. Today vomited. She endorses coughing. No shortness of breath . No nasal congestion, headache, body aches. Occasional intermittent abdominal cramping. Still urinating. No dizziness. No blood or black in stool. She has been drinking fluids. No known ill contacts. Hasn't taken any medications for symptoms. She did go to work today. She works Psychiatrist as well as for [employer]     ROS per HPI, negative if not otherwise mentioned.      Past Medical History:  Diagnosis Date  . Asthma     There are no problems to display for this patient.   History reviewed. No pertinent surgical history.  OB History   No obstetric history on file.      Home Medications    Prior to Admission medications   Medication Sig Start Date End Date Taking? Authorizing Provider  dicyclomine (BENTYL) 20 MG tablet Take 1 tablet (20 mg total) by mouth 2 (two) times daily as needed for spasms. 12/21/20  Yes Silvanna Ohmer, Dorene Grebe B, NP  ondansetron (ZOFRAN-ODT) 4 MG disintegrating tablet Take 1 tablet (4 mg total) by mouth every 8 (eight) hours as needed for nausea or vomiting. 12/21/20  Yes Barnett Elzey, Barron Alvine, NP  albuterol (VENTOLIN HFA) 108 (90 Base) MCG/ACT inhaler Inhale 2 puffs into the lungs every 4 (four) hours as needed for shortness of breath. 02/16/13   [provider]  naproxen (NAPROSYN) 500 MG tablet Take 1 tablet (500 mg total) by mouth 2 (two) times daily. 09/24/20   Hall-Potvin, [place], PA-C    Family History History reviewed. No pertinent family history.  Social History Social History   Tobacco Use  .  Smoking status: Never Smoker  . Smokeless tobacco: Never Used  Substance Use Topics  . Alcohol use: Yes  . Drug use: No     Allergies   Patient has no known allergies.   Review of Systems Review of Systems   Physical Exam Triage Vital Signs ED Triage Vitals  Enc Vitals Group     BP 12/21/20 2004 107/71     Pulse Rate 12/21/20 2004 78     Resp 12/21/20 2004 18     Temp 12/21/20 2004 98.5 F (36.9 C)     Temp Source 12/21/20 2004 Oral     SpO2 12/21/20 2004 99 %     Weight --      Height --      Head Circumference --      Peak Flow --      Pain Score 12/21/20 2006 7     Pain Loc --      Pain Edu? --      Excl. in GC? --    No data found.  Updated Vital Signs BP 107/71 (BP Location: Left Arm)   Pulse 78   Temp 98.5 F (36.9 C) (Oral)   Resp 18   LMP 12/15/2020 (Approximate)   SpO2 99%    Physical Exam Constitutional:      General: She is not in acute distress.    Appearance: She is well-developed. She is ill-appearing.  Cardiovascular:  Rate and Rhythm: Normal rate.  Pulmonary:     Effort: Pulmonary effort is normal.  Abdominal:     Tenderness: There is abdominal tenderness in the epigastric area.  Skin:    General: Skin is warm and dry.  Neurological:     Mental Status: She is alert and oriented to person, place, and time.      UC Treatments / Results  Labs (all labs ordered are listed, but only abnormal results are displayed) Labs Reviewed  SARS CORONAVIRUS 2 (TAT 6-24 HRS)    EKG   Radiology No results found.  Procedures Procedures (including critical care time)  Medications Ordered in UC Medications  ondansetron (ZOFRAN-ODT) disintegrating tablet 4 mg (4 mg Oral Given 12/21/20 2019)    Initial Impression / Assessment and Plan / UC Course  I have reviewed the triage vital signs and the nursing notes.  Pertinent labs & imaging results that were available during my care of the patient were reviewed by me and considered in my  medical decision making (see chart for details).     Nausea, vomiting, diarrhea. Taking liquids. No dizziness. Normal urination. No fevers. Some cough. No tachycardia. History and physical consistent with viral illness. Supportive cares recommended. zofran provided. Covid testing pending and isolation instructions provided.  Return precautions provided. Patient verbalized understanding and agreeable to plan.     Final Clinical Impressions(s) / UC Diagnoses   Final diagnoses:  Nausea vomiting and diarrhea     Discharge Instructions     Self isolate until covid results are back.  We will notify you by phone if it is positive. Your negative results will be sent through your MyChart.    If it is positive you need to isolate from others for a total of 5 days. If no fever for 24 hours without medications, and symptoms improving you may end isolation on day 6, but wear a mask if around any others for an additional 5 days.   Small frequent sips of fluids- Pedialyte, Gatorade, water, broth- to maintain hydration.   Zofran every 8 hours as needed for nausea or vomiting.   If any worsening please return- pain, dizziness, no urination, fevers, or otherwise worsening please return.     ED Prescriptions    Medication Sig Dispense Auth. Provider   ondansetron (ZOFRAN-ODT) 4 MG disintegrating tablet Take 1 tablet (4 mg total) by mouth every 8 (eight) hours as needed for nausea or vomiting. 12 tablet Linus Mako B, NP   dicyclomine (BENTYL) 20 MG tablet Take 1 tablet (20 mg total) by mouth 2 (two) times daily as needed for spasms. 20 tablet Georgetta Haber, NP     PDMP not reviewed this encounter.   Georgetta Haber, NP 12/21/20 2026

## 2020-12-21 NOTE — Discharge Instructions (Addendum)
Self isolate until covid results are back.  We will notify you by phone if it is positive. Your negative results will be sent through your MyChart.    If it is positive you need to isolate from others for a total of 5 days. If no fever for 24 hours without medications, and symptoms improving you may end isolation on day 6, but wear a mask if around any others for an additional 5 days.   Small frequent sips of fluids- Pedialyte, Gatorade, water, broth- to maintain hydration.   Zofran every 8 hours as needed for nausea or vomiting.   If any worsening please return- pain, dizziness, no urination, fevers, or otherwise worsening please return.

## 2020-12-21 NOTE — ED Triage Notes (Signed)
Pt c/o diarrhea x 5 days, vomiting x 1 day, stomach ache. Pt states she feels tightness in her abdomen area. Pt states she has taken Pepto bismol and ginger ale.

## 2020-12-22 LAB — SARS CORONAVIRUS 2 (TAT 6-24 HRS): SARS Coronavirus 2: NEGATIVE
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
## 2021-06-27 ENCOUNTER — Other Ambulatory Visit: Payer: Self-pay

## 2021-06-27 ENCOUNTER — Encounter: Payer: Self-pay | Admitting: Emergency Medicine

## 2021-06-27 ENCOUNTER — Ambulatory Visit
Admission: EM | Admit: 2021-06-27 | Discharge: 2021-06-27 | Disposition: A | Discharge status: Home / Self Care | Payer: BC Managed Care – PPO | Attending: Student | Admitting: Student

## 2021-06-27 DIAGNOSIS — L739 Follicular disorder, unspecified: Secondary | ICD-10-CM | POA: Diagnosis not present

## 2021-06-27 MED ORDER — CEPHALEXIN 250 MG/5ML PO SUSR: 500.0000 mg | Freq: Four times a day (QID) | ORAL | 0 refills | Status: AC

## 2021-06-27 MED ORDER — DOXYCYCLINE HYCLATE 100 MG PO CAPS: 100.0000 mg | ORAL_CAPSULE | Freq: Two times a day (BID) | ORAL | 0 refills | Status: DC

## 2021-06-27 MED ORDER — TRIAMCINOLONE ACETONIDE 0.5 % EX OINT: 1.0000 "application " | TOPICAL_OINTMENT | Freq: Every day | CUTANEOUS | 0 refills | Status: AC

## 2021-06-27 NOTE — ED Triage Notes (Signed)
Pt here for rash to axillary areas with some burning and itching x 2 weeks

## 2021-06-27 NOTE — ED Provider Notes (Signed)
[name]    CSN: [id number] Arrival date & time: 06/27/21  1831      History   Chief Complaint Chief Complaint  Patient presents with   Rash    HPI Martha Simmons is a 25 y.o. female presenting with swelling and discomfort to bilateral axilla for 2 weeks, getting worse.  Right is worse than left.  States this began after using new deodorant.  Denies fever/chills, discharge from the area.  No history of similar in the past.  HPI  Past Medical History:  Diagnosis Date   Asthma     There are no problems to display for this patient.   History reviewed. No pertinent surgical history.  OB History   No obstetric history on file.      Home Medications    Prior to Admission medications   Medication Sig Start Date End Date Taking? Authorizing Provider  cephALEXin (KEFLEX) 250 MG/5ML suspension Take 10 mLs (500 mg total) by mouth 4 (four) times daily for 5 days. 06/27/21 07/02/21 Yes Rhys Martini, PA-C  triamcinolone ointment (KENALOG) 0.5 % Apply 1 application topically daily for 7 days. 06/27/21 07/04/21 Yes Rhys Martini, PA-C  albuterol (VENTOLIN HFA) 108 (90 Base) MCG/ACT inhaler Inhale 2 puffs into the lungs every 4 (four) hours as needed for shortness of breath. 02/16/13   [provider]  dicyclomine (BENTYL) 20 MG tablet Take 1 tablet (20 mg total) by mouth 2 (two) times daily as needed for spasms. 12/21/20   Georgetta Haber, NP  naproxen (NAPROSYN) 500 MG tablet Take 1 tablet (500 mg total) by mouth 2 (two) times daily. 09/24/20   Hall-Potvin, [place], PA-C  ondansetron (ZOFRAN-ODT) 4 MG disintegrating tablet Take 1 tablet (4 mg total) by mouth every 8 (eight) hours as needed for nausea or vomiting. 12/21/20   Georgetta Haber, NP    Family History History reviewed. No pertinent family history.  Social History Social History   Tobacco Use   Smoking status: Never   Smokeless tobacco: Never  Substance Use Topics   Alcohol use: Yes    Drug use: No     Allergies   Patient has no known allergies.   Review of Systems Review of Systems  Skin:  Negative for wound.       Axilla discomfort  All other systems reviewed and are negative.   Physical Exam Triage Vital Signs ED Triage Vitals [06/27/21 1913]  Enc Vitals Group     BP 108/72     Pulse Rate 71     Resp 18     Temp 99.2 F (37.3 C)     Temp Source Oral     SpO2 99 %     Weight      Height      Head Circumference      Peak Flow      Pain Score 2     Pain Loc      Pain Edu?      Excl. in GC?    No data found.  Updated Vital Signs BP 108/72 (BP Location: Left Arm)   Pulse 71   Temp 99.2 F (37.3 C) (Oral)   Resp 18   SpO2 99%   Visual Acuity Right Eye Distance:   Left Eye Distance:   Bilateral Distance:    Right Eye Near:   Left Eye Near:    Bilateral Near:     Physical Exam Vitals reviewed.  Constitutional:  General: She is not in acute distress.    Appearance: Normal appearance. She is not ill-appearing or diaphoretic.  HENT:     Head: Normocephalic and atraumatic.  Cardiovascular:     Rate and Rhythm: Normal rate and regular rhythm.     Heart sounds: Normal heart sounds.  Pulmonary:     Effort: Pulmonary effort is normal.     Breath sounds: Normal breath sounds.  Skin:    General: Skin is warm.     Comments: Bilateral axilla with few areas of mobile induration and tenderness, with hyperpigmented skin. TTP. No warmth or discharge.   Neurological:     General: No focal deficit present.     Mental Status: She is alert and oriented to person, place, and time.  Psychiatric:        Mood and Affect: Mood normal.        Behavior: Behavior normal.        Thought Content: Thought content normal.        Judgment: Judgment normal.     UC Treatments / Results  Labs (all labs ordered are listed, but only abnormal results are displayed) Labs Reviewed - No data to display  EKG   Radiology No results  found.  Procedures Procedures (including critical care time)  Medications Ordered in UC Medications - No data to display  Initial Impression / Assessment and Plan / UC Course  I have reviewed the triage vital signs and the nursing notes.  Pertinent labs & imaging results that were available during my care of the patient were reviewed by me and considered in my medical decision making (see chart for details).     This patient is a very pleasant 25 y.o. year old female presenting with folliculitis bilateral axilla. Afebrile, nontachy. Keflex suspension as patient cannot swallow pills. Triamcinolone. Stop using new deodorant. ED return precautions discussed. Patient verbalizes understanding and agreement.  States she is not pregnant or breast-feeding.  Final Clinical Impressions(s) / UC Diagnoses   Final diagnoses:  Folliculitis of both axillae     Discharge Instructions      -Keflex suspension 4x daily x5 days (do not pick up the doxycycline pills!) -Triamcinolone ointment 1x daily x5 days -Avoid deodorant while symptoms persist -You can try a natural deodorant when symptoms resolve     ED Prescriptions     Medication Sig Dispense Auth. Provider   doxycycline (VIBRAMYCIN) 100 MG capsule  (Status: Discontinued) Take 1 capsule (100 mg total) by mouth 2 (two) times daily for 7 days. 14 capsule Ignacia Bayley E, PA-C   triamcinolone ointment (KENALOG) 0.5 % Apply 1 application topically daily for 7 days. 15 g Rhys Martini, PA-C   cephALEXin [facility]) 250 MG/5ML suspension Take 10 mLs (500 mg total) by mouth 4 (four) times daily for 5 days. 200 mL Rhys Martini, PA-C      PDMP not reviewed this encounter.   Rhys Martini, PA-C 06/27/21 1955

## 2021-06-27 NOTE — Discharge Instructions (Addendum)
-  Keflex suspension 4x daily x5 days (do not pick up the doxycycline pills!) -Triamcinolone ointment 1x daily x5 days -Avoid deodorant while symptoms persist -You can try a natural deodorant when symptoms resolve

## 2022-06-20 ENCOUNTER — Other Ambulatory Visit: Payer: Self-pay

## 2022-06-20 ENCOUNTER — Emergency Department (HOSPITAL_COMMUNITY)
Admission: EM | Admit: 2022-06-20 | Discharge: 2022-06-20 | Disposition: A | Discharge status: Home / Self Care | Payer: BC Managed Care – PPO | Attending: Emergency Medicine | Admitting: Emergency Medicine

## 2022-06-20 DIAGNOSIS — R5383 Other fatigue: Secondary | ICD-10-CM | POA: Insufficient documentation

## 2022-06-20 DIAGNOSIS — R531 Weakness: Secondary | ICD-10-CM | POA: Insufficient documentation

## 2022-06-20 DIAGNOSIS — R55 Syncope and collapse: Secondary | ICD-10-CM | POA: Insufficient documentation

## 2022-06-20 LAB — URINALYSIS, ROUTINE W REFLEX MICROSCOPIC
Bacteria, UA: NONE SEEN
Bilirubin Urine: NEGATIVE
Glucose, UA: NEGATIVE mg/dL
Hgb urine dipstick: NEGATIVE
Ketones, ur: 20 mg/dL — AB
Nitrite: NEGATIVE
Protein, ur: NEGATIVE mg/dL
Specific Gravity, Urine: 1.02 (ref 1.005–1.030)
pH: 6 (ref 5.0–8.0)

## 2022-06-20 LAB — CBC WITH DIFFERENTIAL/PLATELET
Abs Immature Granulocytes: 0.01 10*3/uL (ref 0.00–0.07)
Basophils Absolute: 0 10*3/uL (ref 0.0–0.1)
Basophils Relative: 1 %
Eosinophils Absolute: 0.4 10*3/uL (ref 0.0–0.5)
Eosinophils Relative: 8 %
HCT: 36.1 % (ref 36.0–46.0)
Hemoglobin: 11.3 g/dL — ABNORMAL LOW (ref 12.0–15.0)
Immature Granulocytes: 0 %
Lymphocytes Relative: 26 %
Lymphs Abs: 1.4 10*3/uL (ref 0.7–4.0)
MCH: 28.1 pg (ref 26.0–34.0)
MCHC: 31.3 g/dL (ref 30.0–36.0)
MCV: 89.8 fL (ref 80.0–100.0)
Monocytes Absolute: 0.6 10*3/uL (ref 0.1–1.0)
Monocytes Relative: 11 %
Neutro Abs: 2.9 10*3/uL (ref 1.7–7.7)
Neutrophils Relative %: 54 %
Platelets: 203 10*3/uL (ref 150–400)
RBC: 4.02 MIL/uL (ref 3.87–5.11)
RDW: 12.1 % (ref 11.5–15.5)
WBC: 5.4 10*3/uL (ref 4.0–10.5)
nRBC: 0 % (ref 0.0–0.2)

## 2022-06-20 LAB — BASIC METABOLIC PANEL
Anion gap: 7 (ref 5–15)
BUN: 19 mg/dL (ref 6–20)
CO2: 23 mmol/L (ref 22–32)
Calcium: 9.2 mg/dL (ref 8.9–10.3)
Chloride: 108 mmol/L (ref 98–111)
Creatinine, Ser: 0.94 mg/dL (ref 0.44–1.00)
GFR, Estimated: >60 mL/min (ref >60)
Glucose, Bld: 82 mg/dL (ref 70–99)
Potassium: 3.5 mmol/L (ref 3.5–5.1)
Sodium: 138 mmol/L (ref 135–145)

## 2022-06-20 LAB — PREGNANCY, URINE: Preg Test, Ur: NEGATIVE

## 2022-06-20 MED ORDER — SODIUM CHLORIDE 0.9 % IV BOLUS
1000.0000 mL | Freq: Once | INTRAVENOUS | Status: AC
  Administered 2022-06-20: 1000 mL via INTRAVENOUS

## 2022-06-20 NOTE — Discharge Instructions (Addendum)
Call your primary care doctor or specialist as discussed in the next 2-3 days.   Return immediately back to the ER if:  Your symptoms worsen within the next 12-24 hours. You develop new symptoms such as new fevers, persistent vomiting, new pain, shortness of breath, or new weakness or numbness, or if you have any other concerns.  

## 2022-06-20 NOTE — ED Provider Notes (Signed)
[facility]-EMERGENCY DEPT Provider Note   CSN: [id number] Arrival date & time: 06/20/22  1623     History  Chief Complaint  Patient presents with   Weakness    Martha Simmons is a 26 y.o. female.  Patient presents chief complaint of generalized weakness fatigue, possible near syncope or syncope.  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  She came home and was on her bed talking somebody on the phone when she felt really weak and fatigued.  She thinks she may have passed out while laying on the bed.  Her friend on the phone and called the ambulance.  At this time patient denies any headache or chest pain or abdominal pain.  No preceding pain either.  States that she just feels too weak to do anything.  No reports of any vomiting or diarrhea.  No cough reported.       Home Medications Prior to Admission medications   Medication Sig Start Date End Date Taking? Authorizing Provider  albuterol (VENTOLIN HFA) 108 (90 Base) MCG/ACT inhaler Inhale 2 puffs into the lungs every 4 (four) hours as needed for shortness of breath. 02/16/13   [provider]  dicyclomine (BENTYL) 20 MG tablet Take 1 tablet (20 mg total) by mouth 2 (two) times daily as needed for spasms. 12/21/20   Georgetta Haber, NP  naproxen (NAPROSYN) 500 MG tablet Take 1 tablet (500 mg total) by mouth 2 (two) times daily. 09/24/20   Hall-Potvin, [place], PA-C  ondansetron (ZOFRAN-ODT) 4 MG disintegrating tablet Take 1 tablet (4 mg total) by mouth every 8 (eight) hours as needed for nausea or vomiting. 12/21/20   Georgetta Haber, NP      Allergies    Patient has no known allergies.    Review of Systems   Review of Systems  Constitutional:  Negative for fever.  HENT:  Negative for ear pain.   Eyes:  Negative for pain.  Respiratory:  Negative for cough.   Cardiovascular:  Negative for chest pain.  Gastrointestinal:  Negative for abdominal pain.   Genitourinary:  Negative for flank pain.  Musculoskeletal:  Negative for back pain.  Skin:  Negative for rash.  Neurological:  Negative for headaches.    Physical Exam Updated Vital Signs BP 117/75   Pulse 70   Temp 98.9 F (37.2 C) (Oral)   Resp 17   Ht 5\' 6"  (1.676 m)   Wt 61.7 kg   LMP 05/21/2022   SpO2 100%   BMI 21.95 kg/m  Physical Exam Constitutional:      General: She is not in acute distress.    Appearance: Normal appearance.  HENT:     Head: Normocephalic.     Nose: Nose normal.  Eyes:     Extraocular Movements: Extraocular movements intact.  Cardiovascular:     Rate and Rhythm: Normal rate.  Pulmonary:     Effort: Pulmonary effort is normal.  Musculoskeletal:        General: Normal range of motion.     Cervical back: Normal range of motion.  Neurological:     General: No focal deficit present.     Mental Status: She is alert and oriented to person, place, and time. Mental status is at baseline.     Cranial Nerves: No cranial nerve deficit.     Motor: No weakness.     ED Results / Procedures / Treatments   Labs (  all labs ordered are listed, but only abnormal results are displayed) Labs Reviewed  URINALYSIS, ROUTINE W REFLEX MICROSCOPIC - Abnormal; Notable for the following components:      Result Value   Ketones, ur 20 (*)    Leukocytes,Ua TRACE (*)    All other components within normal limits  CBC WITH DIFFERENTIAL/PLATELET - Abnormal; Notable for the following components:   Hemoglobin 11.3 (*)    All other components within normal limits  BASIC METABOLIC PANEL  PREGNANCY, URINE  CBC WITH DIFFERENTIAL/PLATELET    EKG EKG Interpretation  Date/Time:  Wednesday June 20 2022 16:37:57 EDT Ventricular Rate:  70 PR Interval:  173 QRS Duration: 80 QT Interval:  386 QTC Calculation: 417 R Axis:   49 Text Interpretation: Sinus rhythm Probable left atrial enlargement ST elev, probable normal early repol pattern Confirmed by Norman Clay ([id number])  on 06/20/2022 6:58:55 PM  Radiology No results found.  Procedures Procedures    Medications Ordered in ED Medications  sodium chloride 0.9 % bolus 1,000 mL (0 mLs Intravenous Stopped 06/20/22 1830)    ED Course/ Medical Decision Making/ A&P                           Medical Decision Making Amount and/or Complexity of Data Reviewed Labs: ordered.   Review of records shows a visit June 27, 2021 for folliculitis.  Cardiac monitoring showing sinus rhythm.  Diagnosis is were sent white count of 5 hemoglobin 11 chemistry unremarkable urine pregnancy negative urinalysis unremarkable vital signs remained stable.  Patient given a liter bolus of fluids.  Vital signs continue to have normal heart rate normal blood pressure.  Advised follow-up with the primary care doctor in 3 to 4 days, advised immediate return for worsening symptoms fevers pain or any additional concerns.        Final Clinical Impression(s) / ED Diagnoses Final diagnoses:  Syncope, unspecified syncope type    Rx / DC Orders ED Discharge Orders     None         Cheryll Cockayne, MD 06/20/22 2028

## 2022-06-20 NOTE — ED Triage Notes (Signed)
Pt bib ems for acute onset of weakness x1 hour.  Pt states her ams, legs, and head feel heavy. Pt denies falls, loc, or hx of seizures.

## 2023-01-29 ENCOUNTER — Ambulatory Visit
Admission: EM | Admit: 2023-01-29 | Discharge: 2023-01-29 | Disposition: A | Discharge status: Home / Self Care | Payer: Self-pay | Attending: Physician Assistant | Admitting: Physician Assistant

## 2023-01-29 DIAGNOSIS — R112 Nausea with vomiting, unspecified: Secondary | ICD-10-CM

## 2023-01-29 DIAGNOSIS — K5289 Other specified noninfective gastroenteritis and colitis: Secondary | ICD-10-CM

## 2023-01-29 MED ORDER — ONDANSETRON 4 MG PO TBDP
4.0000 mg | ORAL_TABLET | Freq: Once | ORAL | Status: AC
  Administered 2023-01-29: 4 mg via ORAL

## 2023-01-29 MED ORDER — ONDANSETRON HCL 4 MG PO TABS: 4.0000 mg | ORAL_TABLET | Freq: Three times a day (TID) | ORAL | 0 refills | Status: AC | PRN

## 2023-01-29 NOTE — ED Provider Notes (Signed)
[name]    CSN: [id number] Arrival date & time: 01/29/23  1411      History   Chief Complaint No chief complaint on file.   HPI Martha Simmons is a 27 y.o. female.   27 year old female presents with nausea, vomiting, diarrhea.  Patient indicates that yesterday morning she started having stomach cramping, nausea with repeated vomiting.  Patient indicates that later in the morning she started having diarrhea, loose watery stools that were frequent.  She indicates she has symptoms throughout the day and through the night.  Patient indicates she did take some Pepto-Bismol to try to decrease her symptoms but the medicine did not help.  She does indicate that it turned her stools black.  She has mild fatigue with her symptoms, she is without fever.  She is tolerating fluids well.  She indicates she has not been around any friends or family that been sick, she has not eaten any foods that tasted unusual.  She indicates she has not traveled out of [place] over the past couple weeks.     Past Medical History:  Diagnosis Date   Asthma     There are no problems to display for this patient.   History reviewed. No pertinent surgical history.  OB History   No obstetric history on file.      Home Medications    Prior to Admission medications   Medication Sig Start Date End Date Taking? Authorizing Provider  ondansetron (ZOFRAN) 4 MG tablet Take 1 tablet (4 mg total) by mouth every 8 (eight) hours as needed for nausea or vomiting. 01/29/23  Yes Nyoka Lint, PA-C  albuterol (VENTOLIN HFA) 108 (90 Base) MCG/ACT inhaler Inhale 2 puffs into the lungs every 4 (four) hours as needed for shortness of breath. 02/16/13   [provider]  dicyclomine (BENTYL) 20 MG tablet Take 1 tablet (20 mg total) by mouth 2 (two) times daily as needed for spasms. 12/21/20   Zigmund Gottron, NP  naproxen (NAPROSYN) 500 MG tablet Take 1 tablet (500 mg total) by mouth 2 (two) times  daily. 09/24/20   Hall-Potvin, [place], PA-C  ondansetron (ZOFRAN-ODT) 4 MG disintegrating tablet Take 1 tablet (4 mg total) by mouth every 8 (eight) hours as needed for nausea or vomiting. 12/21/20   Zigmund Gottron, NP    Family History Family History  Family history unknown: Yes    Social History Social History   Tobacco Use   Smoking status: Never   Smokeless tobacco: Never  Substance Use Topics   Alcohol use: Yes   Drug use: No     Allergies   Patient has no known allergies.   Review of Systems Review of Systems  Gastrointestinal:  Positive for abdominal pain (all over) and diarrhea.     Physical Exam Triage Vital Signs ED Triage Vitals  Enc Vitals Group     BP 01/29/23 1444 103/69     Pulse Rate 01/29/23 1444 94     Resp 01/29/23 1444 17     Temp 01/29/23 1444 98.4 F (36.9 C)     Temp Source 01/29/23 1444 Oral     SpO2 01/29/23 1444 98 %     Weight --      Height --      Head Circumference --      Peak Flow --      Pain Score 01/29/23 1446 6     Pain Loc --      Pain Edu? --  Excl. in GC? --    No data found.  Updated Vital Signs BP 103/69 (BP Location: Left Arm)   Pulse 94   Temp 98.4 F (36.9 C) (Oral)   Resp 17   LMP  (LMP Unknown)   SpO2 98%   Visual Acuity Right Eye Distance:   Left Eye Distance:   Bilateral Distance:    Right Eye Near:   Left Eye Near:    Bilateral Near:     Physical Exam Constitutional:      Appearance: Normal appearance.  Cardiovascular:     Rate and Rhythm: Normal rate and regular rhythm.     Heart sounds: Normal heart sounds.  Pulmonary:     Effort: Pulmonary effort is normal.     Breath sounds: Normal breath sounds and air entry. No wheezing, rhonchi or rales.  Abdominal:     General: Abdomen is flat. Bowel sounds are normal.     Palpations: Abdomen is soft.     Tenderness: There is generalized abdominal tenderness. There is no guarding or rebound.  Neurological:     Mental Status: She is  alert.      UC Treatments / Results  Labs (all labs ordered are listed, but only abnormal results are displayed) Labs Reviewed - No data to display  EKG   Radiology No results found.  Procedures Procedures (including critical care time)  Medications Ordered in UC Medications  ondansetron (ZOFRAN-ODT) disintegrating tablet 4 mg (4 mg Oral Given 01/29/23 1508)    Initial Impression / Assessment and Plan / UC Course  I have reviewed the triage vital signs and the nursing notes.  Pertinent labs & imaging results that were available during my care of the patient were reviewed by me and considered in my medical decision making (see chart for details).    Plan: The diagnosis to be treated with the following: 1.  Nausea and vomiting: A.  Zofran 4 mg every 8 hours as needed for stomach cramping and diarrhea. 2.  Gastroenteritis: A.  Zofran 4 mg every 8 hours as needed for stomach cramping and diarrhea. B.  Advised clear liquid diet for the next 48 hours, then bland diet, avoid fried and greasy foods. 3.  Advised follow-up PCP or return to urgent care as needed. Final Clinical Impressions(s) / UC Diagnoses   Final diagnoses:  Nausea and vomiting, unspecified vomiting type  Other noninfectious gastroenteritis     Discharge Instructions      Advised to take the Zofran 4 mg every 8 hours on a regular basis to help decrease stomach cramping, nausea, and this will also help slow the diarrhea. Advised to increase fluid intake-clear liquids-Sprite, ginger ale, 7-Up, 10K, Gatorade.  Advised bland diet over the next 48 hours-avoid fried, greasy, and spicy foods.  Advised follow-up PCP or return to urgent care if symptoms fail to improve.    ED Prescriptions     Medication Sig Dispense Auth. Provider   ondansetron (ZOFRAN) 4 MG tablet Take 1 tablet (4 mg total) by mouth every 8 (eight) hours as needed for nausea or vomiting. 20 tablet Nyoka Lint, PA-C      PDMP not  reviewed this encounter.   Nyoka Lint, PA-C 01/29/23 1512

## 2023-01-29 NOTE — ED Triage Notes (Signed)
Pt presents with generalized abdominal pain with vomiting and diarrhea since eysterday.

## 2023-01-29 NOTE — Discharge Instructions (Signed)
Advised to take the Zofran 4 mg every 8 hours on a regular basis to help decrease stomach cramping, nausea, and this will also help slow the diarrhea. Advised to increase fluid intake-clear liquids-Sprite, ginger ale, 7-Up, 10K, Gatorade.  Advised bland diet over the next 48 hours-avoid fried, greasy, and spicy foods.  Advised follow-up PCP or return to urgent care if symptoms fail to improve.

## 2023-09-05 ENCOUNTER — Encounter (HOSPITAL_COMMUNITY): Payer: Self-pay

## 2023-09-05 ENCOUNTER — Ambulatory Visit
Admission: EM | Admit: 2023-09-05 | Discharge: 2023-09-05 | Disposition: A | Discharge status: Home / Self Care | Payer: Self-pay

## 2023-09-05 ENCOUNTER — Emergency Department (HOSPITAL_COMMUNITY)
Admission: EM | Admit: 2023-09-05 | Discharge: 2023-09-05 | Disposition: A | Discharge status: Home / Self Care | Payer: Self-pay

## 2023-09-05 ENCOUNTER — Other Ambulatory Visit: Payer: Self-pay

## 2023-09-05 ENCOUNTER — Emergency Department (HOSPITAL_COMMUNITY): Payer: Self-pay

## 2023-09-05 DIAGNOSIS — S61211A Laceration without foreign body of left index finger without damage to nail, initial encounter: Secondary | ICD-10-CM | POA: Insufficient documentation

## 2023-09-05 DIAGNOSIS — W231XXA Caught, crushed, jammed, or pinched between stationary objects, initial encounter: Secondary | ICD-10-CM | POA: Insufficient documentation

## 2023-09-05 MED ORDER — IBUPROFEN 100 MG/5ML PO SUSP: 600.0000 mg | Freq: Four times a day (QID) | ORAL | 0 refills | Status: AC | PRN

## 2023-09-05 MED ORDER — LIDOCAINE HCL (PF) 1 % IJ SOLN
30.0000 mL | Freq: Once | INTRAMUSCULAR | Status: AC
  Administered 2023-09-05: 30 mL
  Filled 2023-09-05: qty 30

## 2023-09-05 MED ORDER — MUPIROCIN CALCIUM 2 % EX CREA
TOPICAL_CREAM | Freq: Once | CUTANEOUS | Status: AC
  Administered 2023-09-05: 1 via TOPICAL
  Filled 2023-09-05: qty 15

## 2023-09-05 NOTE — ED Provider Notes (Signed)
[facility] [place] [facility]MENT AT [facility] Provider Note   CSN: [id number] Arrival date & time: 09/05/23  1449     History  Chief Complaint  Patient presents with   Finger Laceration    Martha Simmons is a 27 y.o. female with a history of syncope who presents to the ED today for a laceration.  Patient reports that she was at work when her index finger got caught between a box.  The bleeding is stopped prior to examination.  She denies any pain or difficulty with range of motion left index finger.  No numbness, tingling, or loss of sensation.  Patient does not know when her last tetanus vaccine was.  She states that she is afraid of needles and does not want to update it at this time, despite discussing the importance of the vaccine.  No additional complaints or concerns at this time.    Home Medications Prior to Admission medications   Medication Sig Start Date End Date Taking? Authorizing Provider  ibuprofen (ADVIL) 100 MG/5ML suspension Take 30 mLs (600 mg total) by mouth every 6 (six) hours as needed for up to 10 days. 09/05/23 09/15/23 Yes Maxwell Marion, PA-C  albuterol (VENTOLIN HFA) 108 (90 Base) MCG/ACT inhaler Inhale 2 puffs into the lungs every 4 (four) hours as needed for shortness of breath. 02/16/13   [provider]  dicyclomine (BENTYL) 20 MG tablet Take 1 tablet (20 mg total) by mouth 2 (two) times daily as needed for spasms. 12/21/20   Georgetta Haber, NP  naproxen (NAPROSYN) 500 MG tablet Take 1 tablet (500 mg total) by mouth 2 (two) times daily. 09/24/20   Hall-Potvin, [place], PA-C  ondansetron (ZOFRAN) 4 MG tablet Take 1 tablet (4 mg total) by mouth every 8 (eight) hours as needed for nausea or vomiting. 01/29/23   Ellsworth Lennox, PA-C  ondansetron (ZOFRAN-ODT) 4 MG disintegrating tablet Take 1 tablet (4 mg total) by mouth every 8 (eight) hours as needed for nausea or vomiting. 12/21/20   Georgetta Haber, NP      Allergies    Patient has no known  allergies.    Review of Systems   Review of Systems  Skin:  Positive for wound.  All other systems reviewed and are negative.   Physical Exam Updated Vital Signs BP 122/78   Pulse 78   Temp 98.5 F (36.9 C) (Oral)   Resp 19   Ht 5\' 6"  (1.676 m)   Wt 58.1 kg   LMP 08/15/2023   SpO2 98%   BMI 20.68 kg/m  Physical Exam Vitals and nursing note reviewed.  Constitutional:      General: She is not in acute distress.    Appearance: Normal appearance.  HENT:     Head: Normocephalic and atraumatic.     Mouth/Throat:     Mouth: Mucous membranes are moist.  Eyes:     Conjunctiva/sclera: Conjunctivae normal.     Pupils: Pupils are equal, round, and reactive to light.  Cardiovascular:     Rate and Rhythm: Normal rate and regular rhythm.     Pulses: Normal pulses.     Heart sounds: Normal heart sounds.  Pulmonary:     Effort: Pulmonary effort is normal.     Breath sounds: Normal breath sounds.  Musculoskeletal:        General: Tenderness present. No swelling. Normal range of motion.     Comments: Strength and sensation intact of left index finger  Skin:  General: Skin is warm and dry.     Capillary Refill: Capillary refill takes less than 2 seconds.     Findings: No rash.     Comments: Laceration to the dorsal aspect of left index finger between PIP and DIP joints  Neurological:     General: No focal deficit present.     Mental Status: She is alert.     Sensory: No sensory deficit.     Motor: No weakness.  Psychiatric:        Mood and Affect: Mood normal.        Behavior: Behavior normal.     ED Results / Procedures / Treatments   Labs (all labs ordered are listed, but only abnormal results are displayed) Labs Reviewed - No data to display  EKG None  Radiology DG Finger Index Left  Result Date: 09/05/2023 CLINICAL DATA:  Finger injury. Cut finger after getting caught on a box. EXAM: LEFT INDEX FINGER 2+V COMPARISON:  None Available. FINDINGS: There is no  evidence of fracture or dislocation. There is no evidence of arthropathy or other focal bone abnormality. Skin irregularity about the proximal digit with overlying dressing in place. No radiopaque foreign body. IMPRESSION: Skin irregularity about the proximal digit. No fracture or radiopaque foreign body. Electronically Signed   By: Narda Rutherford M.D.   On: 09/05/2023 16:48    Procedures .Marland KitchenLaceration Repair  Date/Time: 09/05/2023 5:57 PM  Performed by: Maxwell Marion, PA-C Authorized by: Maxwell Marion, PA-C   Anesthesia:    Anesthesia method:  Nerve block   Block anesthetic:  Lidocaine 1% w/o epi Laceration details:    Location:  Finger   Finger location:  L index finger   Length (cm):  4 Skin repair:    Repair method:  Sutures   Suture size:  4-0   Suture material:  Prolene   Suture technique:  Simple interrupted   Number of sutures:  6 Approximation:    Approximation:  Close Post-procedure details:    Dressing:  Antibiotic ointment and non-adherent dressing   Procedure completion:  Tolerated     Medications Ordered in ED Medications  lidocaine (PF) (XYLOCAINE) 1 % injection 30 mL (30 mLs Infiltration Given by Other 09/05/23 1704)  mupirocin cream (BACTROBAN) 2 % (1 Application Topical Given 09/05/23 1747)    ED Course/ Medical Decision Making/ A&P                                 Medical Decision Making Amount and/or Complexity of Data Reviewed Radiology: ordered.  Risk Prescription drug management.   This patient presents to the ED for concern of left index finger laceration, this involves an extensive number of treatment options, and is a complaint that carries with it a high risk of complications and morbidity.   Differential diagnosis includes: Laceration with or without fracture, etc.   Comorbidities  See HPI above   Additional History  Additional history obtained from previous urgent care note.   Imaging Studies  I ordered imaging studies  including left index finger x-ray.  I independently visualized and interpreted imaging which showed: No fracture or radiopaque foreign body. I agree with the radiologist interpretation   Problem List / ED Course / Critical Interventions / Medication Management  Left index finger laceration I ordered medications including: Mupirocin cream I have reviewed the patients home medicines and have made adjustments as needed.   Social Determinants of Health  Occupation   Test / Admission - Considered  Discussed findings with patient. She is hemodynamically stable and safe for discharge home. Return precautions provided.       Final Clinical Impression(s) / ED Diagnoses Final diagnoses:  Laceration of left index finger without foreign body without damage to nail, initial encounter    Rx / DC Orders ED Discharge Orders          Ordered    ibuprofen (ADVIL) 100 MG/5ML suspension  Every 6 hours PRN        09/05/23 1800              Maxwell Marion, PA-C 09/06/23 0108    Durwin Glaze, MD 09/06/23 [phone]

## 2023-09-05 NOTE — ED Provider Notes (Signed)
Patient here today for evaluation of laceration to her left index finger that occurred earlier at work.  She states that she was trying to squeeze between 2 machines and her finger got caught.  Her last tetanus vaccination looks like it was in 2008.  Upon examination patient reports that she is unable to feel the tip of her left index finger.  Recommended further evaluation in the emergency room for better assessment of possible nerve involvement.  Patient is agreeable to same.  She did have presyncopal episode when wound was initially unwrapped but she contracts she is not having any lightheadedness at this time and feels safe to transport herself to local ED.   Tomi Bamberger, PA-C 09/05/23 1358

## 2023-09-05 NOTE — ED Triage Notes (Signed)
"  I am here for left hand (index finger) laceration". "I was at work, carrying a box of chicken, tried to squeeze between 2 machines and my finger got caught". Last Tdap/TD: Unknown.

## 2023-09-05 NOTE — Discharge Instructions (Addendum)
As discussed, your sutures need to be removed in 10 days. You can go to your PCP, urgent care, or ED to get them removed.  Leave the dressing on your laceration for the next 24 hours. After that, you can leave the wound open to air. After the first 24 hours, you can begin to clean the wound site with soap and water to prevent crusting over the suture knots.  Do not go into pools, lakes, or oceans until the sutures are removed in order to prevent infection.   Follow up with your PCP in 1 week. Return to the ED if your symptoms worsens in the interim.

## 2023-09-05 NOTE — ED Triage Notes (Signed)
Patient sent from urgent care due to cutting her left pointer finger after it getting caught on a box. Unknown when last tetanus was.

## 2023-09-18 ENCOUNTER — Ambulatory Visit
Admission: EM | Admit: 2023-09-18 | Discharge: 2023-09-18 | Disposition: A | Discharge status: Home / Self Care | Payer: Self-pay
